# Patient Record
Sex: Female | Born: 1970 | Race: Black or African American | Hispanic: No | Marital: Single | Smoking: Never smoker
Health system: Southern US, Community
[De-identification: ages and names within clinical notes are randomized; demographics above are authoritative.]

## PROBLEM LIST (undated history)

## (undated) DIAGNOSIS — G43909 Migraine, unspecified, not intractable, without status migrainosus: Secondary | ICD-10-CM

## (undated) DIAGNOSIS — I10 Essential (primary) hypertension: Secondary | ICD-10-CM

## (undated) DIAGNOSIS — K219 Gastro-esophageal reflux disease without esophagitis: Secondary | ICD-10-CM

## (undated) DIAGNOSIS — J301 Allergic rhinitis due to pollen: Secondary | ICD-10-CM

## (undated) HISTORY — DX: Migraine, unspecified, not intractable, without status migrainosus: G43.909

## (undated) HISTORY — DX: Gastro-esophageal reflux disease without esophagitis: K21.9

## (undated) HISTORY — DX: Allergic rhinitis due to pollen: J30.1

## (undated) HISTORY — DX: Essential (primary) hypertension: I10

## (undated) HISTORY — PX: ABDOMINAL HYSTERECTOMY: SHX81

## (undated) HISTORY — PX: TUBAL LIGATION: SHX77

---

## 2011-03-21 ENCOUNTER — Emergency Department (HOSPITAL_COMMUNITY)
Admission: EM | Admit: 2011-03-21 | Discharge: 2011-03-22 | Disposition: A | Discharge status: Home / Self Care | Attending: Emergency Medicine | Admitting: Emergency Medicine

## 2011-03-21 DIAGNOSIS — R109 Unspecified abdominal pain: Secondary | ICD-10-CM | POA: Insufficient documentation

## 2011-03-21 DIAGNOSIS — N898 Other specified noninflammatory disorders of vagina: Secondary | ICD-10-CM | POA: Insufficient documentation

## 2011-03-21 DIAGNOSIS — R1909 Other intra-abdominal and pelvic swelling, mass and lump: Secondary | ICD-10-CM | POA: Insufficient documentation

## 2011-03-21 LAB — URINALYSIS, ROUTINE W REFLEX MICROSCOPIC
Hgb urine dipstick: NEGATIVE
Nitrite: NEGATIVE
Specific Gravity, Urine: 1.026 (ref 1.005–1.030)
Urobilinogen, UA: 1 mg/dL (ref 0.0–1.0)
pH: 7.5 (ref 5.0–8.0)

## 2011-03-21 LAB — CBC
Hemoglobin: 13.3 g/dL (ref 12.0–15.0)
Platelets: 246 10*3/uL (ref 150–400)
RBC: 5.03 MIL/uL (ref 3.87–5.11)
WBC: 5.2 10*3/uL (ref 4.0–10.5)

## 2011-03-21 LAB — POCT PREGNANCY, URINE: Preg Test, Ur: NEGATIVE

## 2011-03-22 LAB — BASIC METABOLIC PANEL
CO2: 28 mEq/L (ref 19–32)
Chloride: 103 mEq/L (ref 96–112)
Potassium: 3.6 mEq/L (ref 3.5–5.1)
Sodium: 140 mEq/L (ref 135–145)

## 2011-03-22 LAB — WET PREP, GENITAL
Trich, Wet Prep: NONE SEEN
Yeast Wet Prep HPF POC: NONE SEEN

## 2011-03-22 LAB — LIPASE, BLOOD: Lipase: 15 U/L (ref 11–59)

## 2011-03-22 LAB — HCG, QUANTITATIVE, PREGNANCY: hCG, Beta Chain, Quant, S: 1 m[IU]/mL (ref <5)

## 2011-05-30 ENCOUNTER — Emergency Department (HOSPITAL_COMMUNITY)

## 2011-05-30 ENCOUNTER — Emergency Department (HOSPITAL_COMMUNITY)
Admission: EM | Admit: 2011-05-30 | Discharge: 2011-05-30 | Disposition: A | Discharge status: Home / Self Care | Attending: Emergency Medicine | Admitting: Emergency Medicine

## 2011-05-30 DIAGNOSIS — R1032 Left lower quadrant pain: Secondary | ICD-10-CM | POA: Insufficient documentation

## 2011-05-30 DIAGNOSIS — J069 Acute upper respiratory infection, unspecified: Secondary | ICD-10-CM | POA: Insufficient documentation

## 2011-05-30 DIAGNOSIS — N898 Other specified noninflammatory disorders of vagina: Secondary | ICD-10-CM | POA: Insufficient documentation

## 2011-05-30 DIAGNOSIS — R05 Cough: Secondary | ICD-10-CM | POA: Insufficient documentation

## 2011-05-30 DIAGNOSIS — R059 Cough, unspecified: Secondary | ICD-10-CM | POA: Insufficient documentation

## 2011-05-30 DIAGNOSIS — N390 Urinary tract infection, site not specified: Secondary | ICD-10-CM

## 2011-05-30 DIAGNOSIS — J3489 Other specified disorders of nose and nasal sinuses: Secondary | ICD-10-CM | POA: Insufficient documentation

## 2011-05-30 LAB — URINALYSIS, ROUTINE W REFLEX MICROSCOPIC
Hgb urine dipstick: NEGATIVE
Nitrite: NEGATIVE
Specific Gravity, Urine: 1.025 (ref 1.005–1.030)
Urobilinogen, UA: 1 mg/dL (ref 0.0–1.0)

## 2011-05-30 LAB — CBC
HCT: 30.7 % — ABNORMAL LOW (ref 36.0–46.0)
MCH: 23.4 pg — ABNORMAL LOW (ref 26.0–34.0)
MCHC: 31.3 g/dL (ref 30.0–36.0)
MCV: 74.7 fL — ABNORMAL LOW (ref 78.0–100.0)
RDW: 16.4 % — ABNORMAL HIGH (ref 11.5–15.5)
WBC: 6.6 10*3/uL (ref 4.0–10.5)

## 2011-05-30 LAB — DIFFERENTIAL
Basophils Absolute: 0 10*3/uL (ref 0.0–0.1)
Eosinophils Relative: 2 % (ref 0–5)
Lymphocytes Relative: 30 % (ref 12–46)
Monocytes Absolute: 0.5 10*3/uL (ref 0.1–1.0)

## 2011-05-30 LAB — COMPREHENSIVE METABOLIC PANEL
AST: 9 U/L (ref 0–37)
CO2: 25 mEq/L (ref 19–32)
Calcium: 9.5 mg/dL (ref 8.4–10.5)
Creatinine, Ser: 0.73 mg/dL (ref 0.50–1.10)
GFR calc Af Amer: >90 mL/min (ref >90)
GFR calc non Af Amer: >90 mL/min (ref >90)

## 2011-05-30 LAB — URINE MICROSCOPIC-ADD ON

## 2011-05-30 LAB — WET PREP, GENITAL

## 2011-05-30 MED ORDER — NITROFURANTOIN MONOHYD MACRO 100 MG PO CAPS: 100.0000 mg | ORAL_CAPSULE | Freq: Two times a day (BID) | ORAL | Status: AC

## 2011-05-30 MED ORDER — ONDANSETRON HCL 4 MG/2ML IJ SOLN
4.0000 mg | Freq: Once | INTRAMUSCULAR | Status: AC
  Administered 2011-05-30: 4 mg via INTRAVENOUS
  Filled 2011-05-30: qty 2

## 2011-05-30 MED ORDER — SODIUM CHLORIDE 0.9 % IV SOLN
999.0000 mL | Freq: Once | INTRAVENOUS | Status: AC
  Administered 2011-05-30: 1000 mL via INTRAVENOUS

## 2011-05-30 MED ORDER — NITROFURANTOIN MONOHYD MACRO 100 MG PO CAPS
100.0000 mg | ORAL_CAPSULE | ORAL | Status: AC
  Administered 2011-05-30: 100 mg via ORAL
  Filled 2011-05-30 (×2): qty 1

## 2011-05-30 MED ORDER — HYDROMORPHONE HCL PF 1 MG/ML IJ SOLN
0.5000 mg | Freq: Once | INTRAMUSCULAR | Status: AC
  Administered 2011-05-30: 16:00:00 via INTRAVENOUS
  Filled 2011-05-30: qty 1

## 2011-05-30 NOTE — ED Notes (Signed)
Pt c/o abd pain that started yesterday; denies n/v/d

## 2011-05-30 NOTE — ED Provider Notes (Signed)
History     CSN: [id number] Arrival date & time: 05/30/2011  1:41 PM   First MD Initiated Contact with Patient 05/30/11 1350      Chief Complaint  Patient presents with  . Abdominal Pain    (Consider location/radiation/quality/duration/timing/severity/associated sxs/prior treatment) HPI The patient presents with 2 main complaints. Chief complaint 1, cough and congestion. She notes the gradual onset of congestion, mild cough 3 days ago. Since onset her symptoms have been persistent, not improved with OTC medications. She denies any chest pain, lightheadedness, fevers, chills, vomiting. Chief complaint 2, left lower quadrant pain.  This pain began subacutely approximately 18 hours ago. Since onset the pain is been persistent, is described as sharp, crampy, is focally about the left lower quadrant with no radiation. The patient denies any new vaginal bleeding, remarkable discharge, dysuria. She has a history of tubal ligation. She also denies any diarrhea History reviewed. No pertinent past medical history.  Past Surgical History  Procedure Date  . Tubal ligation     History reviewed. No pertinent family history.  History  Substance Use Topics  . Smoking status: Never Smoker   . Smokeless tobacco: Not on file  . Alcohol Use: Yes     occasional    OB History    Grav Para Term Preterm Abortions TAB SAB Ect Mult Living                  Review of Systems  Constitutional:       HPI  HENT:       HPI otherwise negative  Eyes: Negative.   Respiratory:       HPI, otherwise negative  Cardiovascular:       HPI, otherwise nmegative  Gastrointestinal: Negative for vomiting.  Genitourinary:       HPI, otherwise negative  Musculoskeletal:       HPI, otherwise negative  Skin: Negative.   Neurological: Negative for syncope.    Allergies  Review of patient's allergies indicates no known allergies.  Home Medications   Current Outpatient Rx  Name Route Sig Dispense Refill    . ALBUTEROL SULFATE HFA 108 (90 BASE) MCG/ACT IN AERS Inhalation Inhale 2 puffs into the lungs every 6 (six) hours as needed. For wheezing     . DM-DOXYLAMINE-ACETAMINOPHEN 30-7.5-650 MG/30ML PO LIQD Oral Take 30 mLs by mouth every 6 (six) hours as needed. For cough/congestion     . DOXYCYCLINE MONOHYDRATE 100 MG PO TABS Oral Take 100 mg by mouth 2 (two) times daily.      . IBUPROFEN 200 MG PO TABS Oral Take 400 mg by mouth every 6 (six) hours as needed. For pain     . LORATADINE 10 MG PO TABS Oral Take 10 mg by mouth daily.      Marland Kitchen MENTHOL 10 MG MT LOZG Mouth/Throat Use as directed 1 lozenge in the mouth or throat every 4 (four) hours as needed. For sore throat     . MONTELUKAST SODIUM 10 MG PO TABS Oral Take 10 mg by mouth daily.      Marland Kitchen ALKA-SELTZER PLS NIGHT CLD/FLU PO Oral Take 2 capsules by mouth daily.        BP 135/80  Pulse 92  Temp(Src) 97.6 F (36.4 C) (Oral)  Resp 18  SpO2 100%  LMP 05/15/2011  Physical Exam  Nursing note and vitals reviewed. Constitutional: She is oriented to person, place, and time. She appears well-developed and well-nourished.  HENT:  Head: Normocephalic and atraumatic.  Eyes: EOM are normal.  Cardiovascular: Normal rate and regular rhythm.   Pulmonary/Chest: Effort normal and breath sounds normal.  Abdominal: She exhibits no distension. There is tenderness in the left lower quadrant.  Genitourinary:       Normal external female genitalia. Mild whitish discharge on internal exam, no appreciable erythema or lesions via speculum. No appreciable adnexal tenderness with palpation.  Musculoskeletal: She exhibits no edema and no tenderness.  Neurological: She is alert and oriented to person, place, and time.  Skin: Skin is warm and dry.    ED Course  Procedures (including critical care time)   Labs Reviewed  CBC  DIFFERENTIAL  COMPREHENSIVE METABOLIC PANEL  URINALYSIS, ROUTINE W REFLEX MICROSCOPIC  GC/CHLAMYDIA PROBE AMP, GENITAL  RPR   No  results found.   No diagnosis found.    MDM  This 40 year old female presents with both cough/congestion and left lower quadrant abdominal pain. The patient's cough and congestion are consistent with URI. Absent notable lung field findings, hypoxia, distress she is appropriate for symptomatic care. The patient's left lower quadrant pain was evaluated with an ultrasound which demonstrates mildly enlarged left ovary with multiple cysts, as well as uterine fibroids. The patient's pelvic exam was non-contributory though GC evaluation was sent. The patient had minimal adnexal tenderness, which is reassuring for a very low concern of pid.  The patient's labs are notable for anemia ( she has a notable history of dysfunctional uterine bleeding, and only recently completed successful medical intervention. ) which was discussed with the patient, as well as evidence of a urinary tract infection, for which she will receive treatment. She will be instructed to follow up with her primary care physician and gynecologist.        Gerhard Munch, MD 05/30/11 [phone]

## 2011-05-30 NOTE — ED Notes (Signed)
Pt out for ultrasound

## 2011-05-30 NOTE — ED Notes (Signed)
Pt currently out of room for procedure at this time, will reassess/ complete pelvic when pt returnes

## 2011-05-30 NOTE — ED Notes (Signed)
Back from ultrasound

## 2011-05-31 LAB — GC/CHLAMYDIA PROBE AMP, GENITAL
Chlamydia, DNA Probe: NEGATIVE
GC Probe Amp, Genital: NEGATIVE

## 2017-07-28 ENCOUNTER — Other Ambulatory Visit: Payer: Self-pay

## 2017-07-28 DIAGNOSIS — R51 Headache: Secondary | ICD-10-CM | POA: Insufficient documentation
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
## 2017-07-29 ENCOUNTER — Emergency Department (HOSPITAL_COMMUNITY)
Admission: EM | Admit: 2017-07-29 | Discharge: 2017-07-29 | Disposition: A | Discharge status: Home / Self Care | Attending: Emergency Medicine | Admitting: Emergency Medicine

## 2017-07-29 ENCOUNTER — Emergency Department (HOSPITAL_COMMUNITY)

## 2017-07-29 DIAGNOSIS — R519 Headache, unspecified: Secondary | ICD-10-CM

## 2017-07-29 DIAGNOSIS — R51 Headache: Secondary | ICD-10-CM

## 2017-07-29 MED ORDER — ONDANSETRON 4 MG PO TBDP: 4.0000 mg | ORAL_TABLET | Freq: Three times a day (TID) | ORAL | 0 refills | Status: DC | PRN

## 2017-07-29 MED ORDER — IBUPROFEN 800 MG PO TABS
800.0000 mg | ORAL_TABLET | Freq: Once | ORAL | Status: AC
  Administered 2017-07-29: 800 mg via ORAL
  Filled 2017-07-29: qty 1

## 2017-07-29 NOTE — ED Notes (Signed)
Patient transported to CT 

## 2017-07-29 NOTE — Discharge Instructions (Signed)
As we discussed, your CT was normal.  You may have a mild concussion.  Please read over the information attached on back. Take the prescribed medication as directed. Follow-up with your primary care doctor Return to the ED for new or worsening symptoms.
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
## 2017-10-29 ENCOUNTER — Ambulatory Visit (INDEPENDENT_AMBULATORY_CARE_PROVIDER_SITE_OTHER): Admitting: Family Medicine

## 2017-10-29 ENCOUNTER — Encounter: Payer: Self-pay | Admitting: Family Medicine

## 2017-10-29 VITALS — BP 140/84 | HR 68 | Temp 98.6°F | Ht 65.5 in | Wt 191.2 lb

## 2017-10-29 DIAGNOSIS — Z1231 Encounter for screening mammogram for malignant neoplasm of breast: Secondary | ICD-10-CM

## 2017-10-29 DIAGNOSIS — K219 Gastro-esophageal reflux disease without esophagitis: Secondary | ICD-10-CM | POA: Insufficient documentation

## 2017-10-29 DIAGNOSIS — I10 Essential (primary) hypertension: Secondary | ICD-10-CM | POA: Insufficient documentation

## 2017-10-29 DIAGNOSIS — M722 Plantar fascial fibromatosis: Secondary | ICD-10-CM | POA: Diagnosis not present

## 2017-10-29 LAB — LIPID PANEL
CHOL/HDL RATIO: 4
CHOLESTEROL: 197 mg/dL (ref 0–200)
HDL: 51.7 mg/dL (ref >39.00)
LDL CALC: 120 mg/dL — AB (ref 0–99)
NONHDL: 145
Triglycerides: 124 mg/dL (ref 0.0–149.0)
VLDL: 24.8 mg/dL (ref 0.0–40.0)

## 2017-10-29 LAB — CBC WITH DIFFERENTIAL/PLATELET
Basophils Absolute: 0 10*3/uL (ref 0.0–0.1)
Basophils Relative: 0.5 % (ref 0.0–3.0)
EOS PCT: 2.4 % (ref 0.0–5.0)
Eosinophils Absolute: 0.1 10*3/uL (ref 0.0–0.7)
HCT: 39.8 % (ref 36.0–46.0)
HEMOGLOBIN: 13.4 g/dL (ref 12.0–15.0)
Lymphocytes Relative: 35.5 % (ref 12.0–46.0)
Lymphs Abs: 1.5 10*3/uL (ref 0.7–4.0)
MCHC: 33.7 g/dL (ref 30.0–36.0)
MCV: 81.9 fl (ref 78.0–100.0)
MONO ABS: 0.4 10*3/uL (ref 0.1–1.0)
Monocytes Relative: 9.2 % (ref 3.0–12.0)
Neutro Abs: 2.2 10*3/uL (ref 1.4–7.7)
Neutrophils Relative %: 52.4 % (ref 43.0–77.0)
Platelets: 254 10*3/uL (ref 150.0–400.0)
RBC: 4.86 Mil/uL (ref 3.87–5.11)
RDW: 13.6 % (ref 11.5–15.5)
WBC: 4.1 10*3/uL (ref 4.0–10.5)

## 2017-10-29 LAB — MICROALBUMIN / CREATININE URINE RATIO
CREATININE, U: 150.8 mg/dL
MICROALB UR: 1 mg/dL (ref 0.0–1.9)
Microalb Creat Ratio: 0.7 mg/g (ref 0.0–30.0)

## 2017-10-29 LAB — COMPREHENSIVE METABOLIC PANEL
ALBUMIN: 4.1 g/dL (ref 3.5–5.2)
ALK PHOS: 50 U/L (ref 39–117)
ALT: 18 U/L (ref 0–35)
AST: 20 U/L (ref 0–37)
BUN: 12 mg/dL (ref 6–23)
CHLORIDE: 105 meq/L (ref 96–112)
CO2: 28 mEq/L (ref 19–32)
Calcium: 9.2 mg/dL (ref 8.4–10.5)
Creatinine, Ser: 0.69 mg/dL (ref 0.40–1.20)
GFR: 117.94 mL/min (ref >60.00)
GLUCOSE: 79 mg/dL (ref 70–99)
POTASSIUM: 3.7 meq/L (ref 3.5–5.1)
SODIUM: 138 meq/L (ref 135–145)
Total Bilirubin: 0.5 mg/dL (ref 0.2–1.2)
Total Protein: 7.5 g/dL (ref 6.0–8.3)

## 2017-10-29 MED ORDER — RANITIDINE HCL 150 MG PO TABS: 150.0000 mg | ORAL_TABLET | Freq: Two times a day (BID) | ORAL | 1 refills | Status: AC

## 2017-10-29 MED ORDER — FLUCONAZOLE 150 MG PO TABS: 150.0000 mg | ORAL_TABLET | Freq: Once | ORAL | 0 refills | Status: AC

## 2017-10-29 MED ORDER — LISINOPRIL 10 MG PO TABS: 10.0000 mg | ORAL_TABLET | Freq: Every day | ORAL | 3 refills | Status: AC

## 2017-10-29 NOTE — Assessment & Plan Note (Signed)
Blood pressure reasonably well controlled. Would like her less than 140/80 and ideally less than 130/80.  Continue current anti-hypertensive medications. Will start her back on her lisinopril 1`29m daily. Checking baseline labs today and refills sent in for her. Recommended routine exercise and healthy diet including DASH diet and mediterranean diet. Encouraged weight loss. F/u in 1 month for BP check with nurse. F/u with me for annual exam.
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
## 2017-11-06 ENCOUNTER — Telehealth: Payer: Self-pay | Admitting: Family Medicine

## 2017-11-06 ENCOUNTER — Ambulatory Visit: Payer: Self-pay | Admitting: Podiatry

## 2017-11-06 NOTE — Telephone Encounter (Signed)
Patient called in to complain about an authorization not being put in for her podiatry referral. The patient has [employer] insurance and it requires an authorization for patient to go to any outside office or to have any procedures. I apologized for the lack of education on her insurance authorization process. I also apologized for her having to miss work for this issue. She asked that we enter her referral into [employer]'s network and then call [phone] to have this referral rushed through so that she can make a new appointment. I also told patient that someone from our office would call her when this was complete. I also explained to patient that it would be a good practice for her to check before her appointment time to make sure that she has the proper authorization since her insurance requires this. Patient expressed understanding.

## 2017-11-06 NOTE — Telephone Encounter (Signed)
Called and spoke with patient concerning referral and authorization with [facility]). Explained to patient again we were unaware that a referral authorization was needed, but that it had been placed and sent to [facility] for approval. Patient verbalized understanding.  At this time, no further action needed.

## 2017-11-12 ENCOUNTER — Ambulatory Visit: Payer: Self-pay | Admitting: Family Medicine

## 2017-11-14 ENCOUNTER — Ambulatory Visit: Payer: Self-pay | Admitting: Family Medicine

## 2017-11-14 DIAGNOSIS — Z0289 Encounter for other administrative examinations: Secondary | ICD-10-CM

## 2017-11-16 ENCOUNTER — Encounter: Payer: Self-pay | Admitting: Family Medicine

## 2017-11-16 ENCOUNTER — Ambulatory Visit: Admitting: Family Medicine

## 2017-11-16 VITALS — BP 138/88

## 2017-11-16 DIAGNOSIS — I10 Essential (primary) hypertension: Secondary | ICD-10-CM

## 2017-11-16 MED ORDER — FLUCONAZOLE 150 MG PO TABS: 150.0000 mg | ORAL_TABLET | Freq: Once | ORAL | 3 refills | Status: AC

## 2017-11-16 MED ORDER — MELOXICAM 15 MG PO TABS: 15.0000 mg | ORAL_TABLET | Freq: Every day | ORAL | 0 refills | Status: AC

## 2017-11-16 NOTE — Progress Notes (Deleted)
Patient: Ashlee Barnes MRN: [id number] DOB: 1970/10/18 PCP: Orma Flaming, MD     Subjective:  No chief complaint on file.   HPI: The patient is a 47 y.o. female who presents today for ***  Review of Systems  Allergies Patient has No Known Allergies.  Past Medical History Patient  has a past medical history of GERD (gastroesophageal reflux disease), Hay fever, Hypertension, and Migraines.  Surgical History Patient  has a past surgical history that includes Tubal ligation and Abdominal hysterectomy.  Family History Pateint's family history includes Alcohol abuse in her maternal grandfather and paternal grandmother; Arthritis in her father and mother; Asthma in her mother; COPD in her father; Cancer in her maternal grandfather; Depression in her maternal grandmother, mother, and son; Drug abuse in her father and mother; Hearing loss in her paternal grandmother; Heart attack in her paternal grandmother; High Cholesterol in her maternal grandmother and paternal grandfather; Miscarriages / [place] in her mother; Stroke in her maternal grandmother.  Social History Patient  reports that she has never smoked. She has never used smokeless tobacco. She reports that she drinks alcohol. She reports that she does not use drugs.    Objective: There were no vitals filed for this visit.  There is no height or weight on file to calculate BMI.  Physical Exam     Assessment/plan:      No follow-ups on file.     Orma Flaming, MD [place]  11/16/2017

## 2017-11-16 NOTE — Progress Notes (Signed)
Patient should not have been seen today as it has not been a month since I saw her last. Unsure why this was scheduled.   -did do manual check of BP today and improved. See her back in 2-3 weeks for f/u. -mobic px'd for plantar fasciitis and discussed not to use any other NSAID product. If makes her GERD worse we try for pennsaid or voltaren gel.   -forgot to send in diflucan at last visit, so sent in today.   No charge visit.   Orma Flaming, MD [place]

## 2017-11-28 ENCOUNTER — Ambulatory Visit (INDEPENDENT_AMBULATORY_CARE_PROVIDER_SITE_OTHER): Admitting: Podiatry

## 2017-11-28 ENCOUNTER — Encounter: Payer: Self-pay | Admitting: *Deleted

## 2017-11-28 VITALS — BP 176/98 | HR 72

## 2017-11-28 DIAGNOSIS — M722 Plantar fascial fibromatosis: Secondary | ICD-10-CM | POA: Diagnosis not present

## 2017-11-28 DIAGNOSIS — M216X1 Other acquired deformities of right foot: Secondary | ICD-10-CM | POA: Diagnosis not present

## 2017-11-28 DIAGNOSIS — M216X2 Other acquired deformities of left foot: Secondary | ICD-10-CM | POA: Diagnosis not present

## 2017-11-28 NOTE — Progress Notes (Signed)
SUBJECTIVE: 47 y.o. year old female presents complaining of pain in right heel since 2017. Pain is most early in the morning or late at the and of activity. Used OTC orthotics in [employer].  Review of Systems  Constitutional: Negative.   HENT: Negative.   Eyes: Negative.   Respiratory: Negative.   Cardiovascular: Negative.   Gastrointestinal: Positive for heartburn.  Genitourinary: Negative.   Musculoskeletal: Negative.   Skin: Negative.      OBJECTIVE: DERMATOLOGIC EXAMINATION: Normal finding.   VASCULAR EXAMINATION OF LOWER LIMBS: All pedal pulses are palpable with normal pulsation.  Capillary Filling times within 3 seconds in all digits.  No edema or erythema noted. Temperature gradient from tibial crest to dorsum of foot is within normal bilateral.  NEUROLOGIC EXAMINATION OF THE LOWER LIMBS: All epicritic and tactile sensations grossly intact.  MUSCULOSKELETAL EXAMINATION: High arched cavus type foot. Pain in right heel.  ASSESSMENT: Plantar fasciitis right. Pes cavus.  PLAN: Reviewed findings and available treatment options. Continue with existing Night splint. Right heel Injected with mixture of 4 mg Dexamethasone, 4 mg Triamcinolone, and 1 cc of 0.5% Marcaine plain. Patient tolerated well without difficulty.  May benefit from custom orthotics. Will request for benefit approval.

## 2017-11-28 NOTE — Patient Instructions (Signed)
Seen for right plantar fasciitis. Noted of high arched foot with weak first metatarsal bone right. Reviewed available treatment options. Need custom orthotics, reduce strenuous walking or running. Cortisone injection given. Continue using Night Splint. Will call when we get orthotic approval.

## 2017-11-29 ENCOUNTER — Ambulatory Visit: Payer: Self-pay | Admitting: Family Medicine

## 2017-11-29 DIAGNOSIS — Z0289 Encounter for other administrative examinations: Secondary | ICD-10-CM

## 2017-11-29 NOTE — Progress Notes (Deleted)
Patient: Ashlee Barnes MRN: [id number] DOB: 02-01-1971 PCP: Ashlee Flaming, MD     Subjective:  No chief complaint on file.   HPI: The patient is a 47 y.o. female who presents today for hypertension follow up.   Hypertension: Here for follow up of hypertension.  Currently on lisinopril . Home readings range from *** systolic/*** diastolic. Takes medication as prescribed and denies any side effects. Exercise includes ***. Weight has been stable. Denies any chest pain, headaches, shortness of breath, vision changes, swelling in lower extremities.    Review of Systems  Allergies Patient has No Known Allergies.  Past Medical History Patient  has a past medical history of GERD (gastroesophageal reflux disease), Hay fever, Hypertension, and Migraines.  Surgical History Patient  has a past surgical history that includes Tubal ligation and Abdominal hysterectomy.  Family History Pateint's family history includes Alcohol abuse in her maternal grandfather and paternal grandmother; Arthritis in her father and mother; Asthma in her mother; COPD in her father; Cancer in her maternal grandfather; Depression in her maternal grandmother, mother, and son; Drug abuse in her father and mother; Hearing loss in her paternal grandmother; Heart attack in her paternal grandmother; High Cholesterol in her maternal grandmother and paternal grandfather; Miscarriages / [place] in her mother; Stroke in her maternal grandmother.  Social History Patient  reports that she has never smoked. She has never used smokeless tobacco. She reports that she drinks alcohol. She reports that she does not use drugs.    Objective: There were no vitals filed for this visit.  There is no height or weight on file to calculate BMI.  Physical Exam     Assessment/plan:      No follow-ups on file.     @AWME @ 11/29/2017

## 2017-12-05 ENCOUNTER — Encounter: Payer: Self-pay | Admitting: Podiatry

## 2018-03-15 ENCOUNTER — Encounter: Admitting: Family Medicine

## 2018-03-15 DIAGNOSIS — Z0289 Encounter for other administrative examinations: Secondary | ICD-10-CM

## 2018-03-15 NOTE — Progress Notes (Deleted)
Patient: Ashlee Barnes MRN: [id number] DOB: Oct 04, 1970 PCP: Orma Flaming, MD     Subjective:  No chief complaint on file.   HPI: The patient is a 47 y.o. female who presents today for annual exam. {He/she (caps):30048} denies any changes to past medical history. There have been no recent hospitalizations. They {Actions; are/are not:16769} following a well balanced diet and exercise plan. Weight has been {trend:16658}. No complaints today.    There is no immunization history on file for this patient. Colonoscopy: Mammogram:  Pap smear:  PSA:   Review of Systems  Allergies Patient has No Known Allergies.  Past Medical History Patient  has a past medical history of GERD (gastroesophageal reflux disease), Hay fever, Hypertension, and Migraines.  Surgical History Patient  has a past surgical history that includes Tubal ligation and Abdominal hysterectomy.  Family History Pateint's family history includes Alcohol abuse in her maternal grandfather and paternal grandmother; Arthritis in her father and mother; Asthma in her mother; COPD in her father; Cancer in her maternal grandfather; Depression in her maternal grandmother, mother, and son; Drug abuse in her father and mother; Hearing loss in her paternal grandmother; Heart attack in her paternal grandmother; High Cholesterol in her maternal grandmother and paternal grandfather; Miscarriages / [place] in her mother; Stroke in her maternal grandmother.  Social History Patient  reports that she has never smoked. She has never used smokeless tobacco. She reports that she drinks alcohol. She reports that she does not use drugs.    Objective: There were no vitals filed for this visit.  There is no height or weight on file to calculate BMI.  Physical Exam     Assessment/plan:   No problem-specific Assessment & Plan notes found for this encounter.    No follow-ups on file.     Orma Flaming, MD [facility]  03/15/2018
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
## 2020-12-30 ENCOUNTER — Telehealth: Payer: Self-pay

## 2020-12-30 NOTE — Telephone Encounter (Signed)
Invalid number for patient, unable to advise Dr.Wolfe has left.
